# Patient Record
Sex: Female | Born: 1970 | Race: White | Hispanic: No | Marital: Single | Smoking: Current every day smoker
Health system: Southern US, Community
[De-identification: ages and names within clinical notes are randomized; demographics above are authoritative.]

## PROBLEM LIST (undated history)

## (undated) DIAGNOSIS — F419 Anxiety disorder, unspecified: Secondary | ICD-10-CM

---

## 2008-11-20 ENCOUNTER — Ambulatory Visit: Payer: Self-pay | Admitting: Obstetrics and Gynecology

## 2012-11-01 ENCOUNTER — Ambulatory Visit: Payer: Self-pay | Admitting: Obstetrics and Gynecology

## 2014-04-17 ENCOUNTER — Emergency Department: Payer: Self-pay | Admitting: Emergency Medicine

## 2014-05-25 ENCOUNTER — Emergency Department: Payer: Self-pay | Admitting: Emergency Medicine

## 2014-05-25 LAB — COMPREHENSIVE METABOLIC PANEL
ALBUMIN: 4.1 g/dL (ref 3.4–5.0)
ALK PHOS: 98 U/L
ANION GAP: 8 (ref 7–16)
BILIRUBIN TOTAL: 0.3 mg/dL (ref 0.2–1.0)
BUN: 9 mg/dL (ref 7–18)
CHLORIDE: 109 mmol/L — AB (ref 98–107)
CO2: 23 mmol/L (ref 21–32)
Calcium, Total: 8.9 mg/dL (ref 8.5–10.1)
Creatinine: 0.78 mg/dL (ref 0.60–1.30)
EGFR (African American): >60
EGFR (Non-African Amer.): >60
Glucose: 106 mg/dL — ABNORMAL HIGH (ref 65–99)
OSMOLALITY: 279 (ref 275–301)
POTASSIUM: 3.7 mmol/L (ref 3.5–5.1)
SGOT(AST): 23 U/L (ref 15–37)
SGPT (ALT): 21 U/L
SODIUM: 140 mmol/L (ref 136–145)
TOTAL PROTEIN: 7.8 g/dL (ref 6.4–8.2)

## 2014-05-25 LAB — CBC WITH DIFFERENTIAL/PLATELET
BASOS ABS: 0.1 10*3/uL (ref 0.0–0.1)
BASOS PCT: 0.6 %
EOS PCT: 0.2 %
Eosinophil #: 0 10*3/uL (ref 0.0–0.7)
HCT: 41.8 % (ref 35.0–47.0)
HGB: 14.2 g/dL (ref 12.0–16.0)
LYMPHS PCT: 20.8 %
Lymphocyte #: 3.3 10*3/uL (ref 1.0–3.6)
MCH: 33 pg (ref 26.0–34.0)
MCHC: 33.9 g/dL (ref 32.0–36.0)
MCV: 97 fL (ref 80–100)
Monocyte #: 1 x10 3/mm — ABNORMAL HIGH (ref 0.2–0.9)
Monocyte %: 6.1 %
Neutrophil #: 11.4 10*3/uL — ABNORMAL HIGH (ref 1.4–6.5)
Neutrophil %: 72.3 %
Platelet: 302 10*3/uL (ref 150–440)
RBC: 4.3 10*6/uL (ref 3.80–5.20)
RDW: 12.4 % (ref 11.5–14.5)
WBC: 15.7 10*3/uL — AB (ref 3.6–11.0)

## 2014-05-25 LAB — URINALYSIS, COMPLETE
BILIRUBIN, UR: NEGATIVE
Glucose,UR: NEGATIVE mg/dL (ref 0–75)
KETONE: NEGATIVE
Leukocyte Esterase: NEGATIVE
Nitrite: NEGATIVE
PH: 6 (ref 4.5–8.0)
Protein: NEGATIVE
RBC,UR: 1 /HPF (ref 0–5)
SPECIFIC GRAVITY: 1.002 (ref 1.003–1.030)
Squamous Epithelial: <1
WBC UR: NONE SEEN /HPF (ref 0–5)

## 2014-05-25 LAB — TROPONIN I: Troponin-I: <0.02 ng/mL

## 2014-05-25 LAB — PROTIME-INR
INR: 0.9
Prothrombin Time: 12.2 secs (ref 11.5–14.7)

## 2014-06-21 ENCOUNTER — Ambulatory Visit: Payer: Self-pay | Admitting: Family Medicine

## 2015-11-18 ENCOUNTER — Ambulatory Visit (INDEPENDENT_AMBULATORY_CARE_PROVIDER_SITE_OTHER): Payer: BC Managed Care – PPO

## 2015-11-18 ENCOUNTER — Ambulatory Visit
Admission: EM | Admit: 2015-11-18 | Discharge: 2015-11-18 | Disposition: A | Discharge status: Home / Self Care | Payer: BC Managed Care – PPO | Attending: Family Medicine | Admitting: Family Medicine

## 2015-11-18 ENCOUNTER — Encounter: Payer: Self-pay | Admitting: *Deleted

## 2015-11-18 DIAGNOSIS — S9031XA Contusion of right foot, initial encounter: Secondary | ICD-10-CM

## 2015-11-18 DIAGNOSIS — S92001A Unspecified fracture of right calcaneus, initial encounter for closed fracture: Secondary | ICD-10-CM

## 2015-11-18 HISTORY — DX: Anxiety disorder, unspecified: F41.9

## 2015-11-18 MED ORDER — IBUPROFEN 800 MG PO TABS: 800.0000 mg | ORAL_TABLET | Freq: Three times a day (TID) | ORAL | Status: AC | PRN

## 2015-11-18 MED ORDER — OXYCODONE-ACETAMINOPHEN 5-325 MG PO TABS: 1.0000 | ORAL_TABLET | Freq: Three times a day (TID) | ORAL | Status: DC | PRN

## 2015-11-18 NOTE — Discharge Instructions (Signed)
Take medication as prescribed. Rest. Elevate and apply ice. Use crutches and keep in splint.   Follow up with podiatry this week as scheduled.  Follow up with your primary care physician this week as needed. Return to Urgent care for new or worsening concerns.

## 2015-11-18 NOTE — ED Provider Notes (Signed)
[facility]  ____________________________________________  Time seen: Approximately 5:11 PM  I have reviewed the triage vital signs and the nursing notes.   HISTORY  Chief Complaint Foot Pain   HPI Tina Daniel is a 45 y.o. female presents with complaint of right lateral foot pain. Patient states that this morning she was at her home mowing her yard. Patient states she was using a push mower and accidentally rolled her right foot and ankle and she stepped off the curb from the grass. Patient presented she did fall but denies any other pain or injury. States right foot pain since. Denies head injury or loss of consciousness.   Patient reports that she was able to continue mowing as well as blow the sidewalk. However reports when she rested and elevated her foot after taking her shoe off, she then was able to apply weight. Patient reports 6 out of 10 pain at this time and increased pain with active weightbearing. Denies any pain radiation. Denies numbness or tingling sensation.  Patient again denies any other pain or injury. Denies head injury or loss of conscious. Denies neck or back pain or injury, or other extremity pain, other extremity swelling, recent sickness, dizziness, weakness, headache, vision changes, chest pain, shortness breath or abdominal pain.  Patient reports that she did call podiatry today to schedule a follow-up appointment prior to coming to urgent care. Patient states that she has an appointment with Dr. Orland Jarredroxler podiatrist this Wednesday.   [facility] PCP  No LMP recorded. Patient has had an ablation.  Past Medical History  Diagnosis Date  . Anxiety     There are no active problems to display for this patient.   History reviewed. No pertinent past surgical history.  Current Outpatient Rx  Name  Route  Sig  Dispense  Refill  . citalopram (CELEXA) 40 MG tablet   Oral   Take 40 mg by mouth daily.            Allergies Penicillins  History reviewed. No pertinent family history.  Social History Social History  Substance Use Topics  . Smoking status: Current Every Day Smoker  . Smokeless tobacco: Never Used  . Alcohol Use: Yes    Review of Systems Constitutional: No fever/chills Eyes: No visual changes. ENT: No sore throat. Cardiovascular: Denies chest pain. Respiratory: Denies shortness of breath. Gastrointestinal: No abdominal pain.  No nausea, no vomiting.  No diarrhea.  No constipation. Genitourinary: Negative for dysuria. Musculoskeletal: Negative for back pain.As above. Skin: Negative for rash. Neurological: Negative for headaches, focal weakness or numbness.  10-point ROS otherwise negative.  ____________________________________________   PHYSICAL EXAM:  VITAL SIGNS: ED Triage Vitals  Enc Vitals Group     BP 11/18/15 1623 128/78 mmHg     Pulse Rate 11/18/15 1623 101 Recheck 84      Resp 11/18/15 1623 18     Temp 11/18/15 1623 98.7 F (37.1 C)     Temp Source 11/18/15 1623 Oral     SpO2 11/18/15 1623 98 %     Weight 11/18/15 1623 165 lb (74.844 kg)     Height 11/18/15 1623 5\' 4"  (1.626 m)     Head Cir --      Peak Flow --      Pain Score 11/18/15 1635 8     Pain Loc --      Pain Edu? --      Excl. in GC? --     Constitutional: Alert  and oriented. Well appearing and in no acute distress. Eyes: Conjunctivae are normal. PERRL. EOMI. Head: Atraumatic.  Ears:  Normal external appearance bilaterally.  Nose: No congestion/rhinnorhea.  Mouth/Throat: Mucous membranes are moist.   Neck: No stridor.  No cervical spine tenderness to palpation. Cardiovascular: Normal rate, regular rhythm. Grossly normal heart sounds.  Good peripheral circulation. Respiratory: Normal respiratory effort.  No retractions. Lungs CTAB. no wheezes, rales or rhonchi. Gastrointestinal: Soft and nontender. No distention. No CVA tenderness. Musculoskeletal: No lower or upper extremity  tenderness nor edema.  No midline cervical, thoracic or lumbar tenderness patient. Bilateral pedal pulses equal and easily palpated.  Except: right dorsal lateral foot at base of 4/5 metatarsals mild swelling and ecchymosis, with moderate tenderness to palpation, pain with right foot dorsiflexion, no malleolar tenderness, sensation intact, capillary refill normal, no motor or tendon deficit, right lower extremity otherwise nontender.  Neurologic:  Normal speech and language. No gross focal neurologic deficits are appreciated. No gait instability. Skin:  Skin is warm, dry and intact. No rash noted. Psychiatric: Mood and affect are normal. Speech and behavior are normal.  ____________________________________________   LABS (all labs ordered are listed, but only abnormal results are displayed)  Labs Reviewed - No data to display ____________________________________________  RADIOLOGY  Dg Foot Complete Right  11/18/2015  CLINICAL DATA:  45 year old female with twisting injury while mowing the lawn. Pain and swelling near the base of the fourth metatarsal. Initial encounter. EXAM: RIGHT FOOT COMPLETE - 3+ VIEW COMPARISON:  None. FINDINGS: Overall bone mineralization is normal. On the lateral view the calcaneus appears intact with mild degenerative spurring. However, on the AP view there is mild cortical irregularity along the antral lateral calcaneus (arrow). Tarsal bone alignment within normal limits. TMT and other metatarsal alignment appears normal. No metatarsal fracture identified. There does appear to be soft tissue swelling lateral to the fifth metatarsal shaft. Distal joint spaces and alignment are within normal limits. Phalanges appear intact. IMPRESSION: 1. Appearance suspicious for avulsion fracture along the anterior lateral calcaneus, such as due to avulsion injury of the extensor digitorum brevis. 2. No other acute fracture or dislocation identified about the right foot. Electronically  Signed   By: Odessa Fleming M.D.   On: 11/18/2015 16:53   I, Renford Dills, personally viewed and evaluated these images (plain radiographs) as part of my medical decision making, as well as reviewing the written report by the radiologist. ____________________________________________   PROCEDURES  Procedure(s) performed:  Crutches and posterior ocl splint applied by RN, neurovascular intact post application.  ____________________________________________   INITIAL IMPRESSION / ASSESSMENT AND PLAN / ED COURSE  Pertinent labs & imaging results that were available during my care of the patient were reviewed by me and considered in my medical decision making (see chart for details).  Well-appearing patient. Presents for complaints of right lateral foot pain post mechanical injury prior to arrival today. Denies other pain or injury. Per radiologist's right foot x-ray appearance suspicious for avulsion fracture along the anterior lateral calcaneus, no other fracture or dislocation about the right foot. Discussed with patient. Posterior splint applied. Crutches given. Will treat with when necessary ibuprofen and Percocet. Encouraged rest, ice and elevation. Patient reports that she did call and obtain podiatry appointment for this week. Encouraged patient to keep podiatry appointment and follow-up this week.Discussed indication, risks and benefits of medications with patient.   Discussed follow up with Primary care physician this week. Discussed follow up and return parameters including no resolution or any  worsening concerns. Patient verbalized understanding and agreed to plan.   ____________________________________________   FINAL CLINICAL IMPRESSION(S) / ED DIAGNOSES  Final diagnoses:  Calcaneus fracture, right, closed, initial encounter  Foot contusion, right, initial encounter     New Prescriptions   IBUPROFEN (ADVIL,MOTRIN) 800 MG TABLET    Take 1 tablet (800 mg total) by mouth every 8  (eight) hours as needed for mild pain or moderate pain.   OXYCODONE-ACETAMINOPHEN (ROXICET) 5-325 MG TABLET    Take 1 tablet by mouth every 8 (eight) hours as needed for moderate pain or severe pain (Do not drive or operate heavy machinery while taking as can cause drowsiness.).    Note: This dictation was prepared with Dragon dictation along with smaller phrase technology. Any transcriptional errors that result from this process are unintentional.       Renford Dills, NP 11/18/15 1751  Renford Dills, NP 11/18/15 1751

## 2015-11-18 NOTE — ED Notes (Signed)
Patient was mowing grass today when she stepped off a curb and turned her ankle. No history of right foot injuries.

## 2016-09-30 ENCOUNTER — Other Ambulatory Visit: Payer: Self-pay | Admitting: Family Medicine

## 2016-09-30 DIAGNOSIS — Z1231 Encounter for screening mammogram for malignant neoplasm of breast: Secondary | ICD-10-CM

## 2016-10-12 ENCOUNTER — Ambulatory Visit
Admission: RE | Admit: 2016-10-12 | Discharge: 2016-10-12 | Disposition: A | Discharge status: Home / Self Care | Payer: BC Managed Care – PPO | Source: Ambulatory Visit | Attending: Family Medicine | Admitting: Family Medicine

## 2016-10-12 DIAGNOSIS — Z1231 Encounter for screening mammogram for malignant neoplasm of breast: Secondary | ICD-10-CM | POA: Insufficient documentation

## 2017-07-06 ENCOUNTER — Ambulatory Visit
Admission: EM | Admit: 2017-07-06 | Discharge: 2017-07-06 | Disposition: A | Discharge status: Home / Self Care | Payer: BC Managed Care – PPO | Attending: Family Medicine | Admitting: Family Medicine

## 2017-07-06 ENCOUNTER — Encounter: Payer: Self-pay | Admitting: Emergency Medicine

## 2017-07-06 ENCOUNTER — Other Ambulatory Visit: Payer: Self-pay

## 2017-07-06 DIAGNOSIS — R05 Cough: Secondary | ICD-10-CM | POA: Diagnosis not present

## 2017-07-06 DIAGNOSIS — R69 Illness, unspecified: Secondary | ICD-10-CM

## 2017-07-06 DIAGNOSIS — R5383 Other fatigue: Secondary | ICD-10-CM | POA: Diagnosis not present

## 2017-07-06 DIAGNOSIS — J029 Acute pharyngitis, unspecified: Secondary | ICD-10-CM

## 2017-07-06 DIAGNOSIS — J111 Influenza due to unidentified influenza virus with other respiratory manifestations: Secondary | ICD-10-CM

## 2017-07-06 DIAGNOSIS — M791 Myalgia, unspecified site: Secondary | ICD-10-CM | POA: Diagnosis not present

## 2017-07-06 MED ORDER — OSELTAMIVIR PHOSPHATE 75 MG PO CAPS: 75.0000 mg | ORAL_CAPSULE | Freq: Two times a day (BID) | ORAL | 0 refills | Status: DC

## 2017-07-06 MED ORDER — HYDROCOD POLST-CPM POLST ER 10-8 MG/5ML PO SUER: 5.0000 mL | Freq: Two times a day (BID) | ORAL | 0 refills | Status: DC

## 2017-07-06 MED ORDER — BENZONATATE 200 MG PO CAPS: ORAL_CAPSULE | ORAL | 0 refills | Status: DC

## 2017-07-06 NOTE — ED Triage Notes (Signed)
Patient c/o cough, congestion, fever, and bodyaches that started 2 days ago.

## 2017-07-06 NOTE — ED Provider Notes (Signed)
[facility]    CSN: [id number] Arrival date & time: 07/06/17  1848     History   Chief Complaint Chief Complaint  Patient presents with  . Cough  . Fever  . Generalized Body Aches    HPI Tina Daniel is a 47 y.o. female.   HPI  47 year old female, second grade teacher, presents with sudden onset of cough congestion fever and body aches that started 2 days ago.  First coughing was severe causing her to feel a burning sensation in her throat and through her anterior upper chest.  Running a fever in the low 100s.  She did not receive a flu shot this year nor ever.  Pulse rate is 105 today temperature is 99 degrees after taking NSAID at home shortly before coming here.         Past Medical History:  Diagnosis Date  . Anxiety     There are no active problems to display for this patient.   History reviewed. No pertinent surgical history.  OB History    No data available       Home Medications    Prior to Admission medications   Medication Sig Start Date End Date Taking? Authorizing Provider  citalopram (CELEXA) 40 MG tablet Take 40 mg by mouth daily.   Yes [provider]  benzonatate (TESSALON) 200 MG capsule Take one cap TID PRN cough 07/06/17   Lutricia Feil, PA-C  chlorpheniramine-HYDROcodone [facility] ER) 10-8 MG/5ML SUER Take 5 mLs by mouth 2 (two) times daily. 07/06/17   Lutricia Feil, PA-C  ibuprofen (ADVIL,MOTRIN) 800 MG tablet Take 1 tablet (800 mg total) by mouth every 8 (eight) hours as needed for mild pain or moderate pain. 11/18/15   Renford Dills, NP  oseltamivir (TAMIFLU) 75 MG capsule Take 1 capsule (75 mg total) by mouth every 12 (twelve) hours. 07/06/17   Lutricia Feil, PA-C    Family History Family History  Problem Relation Age of Onset  . Breast cancer Neg Hx     Social History Social History   Tobacco Use  . Smoking status: Current Every Day Smoker    Types: Cigarettes  . Smokeless  tobacco: Never Used  Substance Use Topics  . Alcohol use: Yes  . Drug use: No     Allergies   Penicillins   Review of Systems Review of Systems  Constitutional: Positive for activity change, chills, fatigue and fever.  HENT: Positive for congestion, postnasal drip and sore throat.   Respiratory: Positive for cough and shortness of breath.   Musculoskeletal: Positive for myalgias.  All other systems reviewed and are negative.    Physical Exam Triage Vital Signs ED Triage Vitals  Enc Vitals Group     BP 07/06/17 1858 113/76     Pulse Rate 07/06/17 1858 (!) 105     Resp 07/06/17 1858 16     Temp 07/06/17 1858 99 F (37.2 C)     Temp Source 07/06/17 1858 Oral     SpO2 07/06/17 1858 98 %     Weight 07/06/17 1855 175 lb (79.4 kg)     Height 07/06/17 1855 5\' 4"  (1.626 m)     Head Circumference --      Peak Flow --      Pain Score 07/06/17 1855 5     Pain Loc --      Pain Edu? --      Excl. in GC? --    No  data found.  Updated Vital Signs BP 113/76 (BP Location: Left Arm)   Pulse (!) 105   Temp 99 F (37.2 C) (Oral)   Resp 16   Ht 5\' 4"  (1.626 m)   Wt 175 lb (79.4 kg)   SpO2 98%   BMI 30.04 kg/m   Visual Acuity Right Eye Distance:   Left Eye Distance:   Bilateral Distance:    Right Eye Near:   Left Eye Near:    Bilateral Near:     Physical Exam  Constitutional: She is oriented to person, place, and time. She appears well-developed and well-nourished. No distress.  HENT:  Head: Normocephalic.  Right Ear: External ear normal.  Left Ear: External ear normal.  Nose: Nose normal.  Mouth/Throat: Oropharynx is clear and moist. No oropharyngeal exudate.  Eyes: Pupils are equal, round, and reactive to light. Right eye exhibits no discharge. Left eye exhibits no discharge.  Neck: Normal range of motion.  Pulmonary/Chest: Effort normal and breath sounds normal.  Musculoskeletal: Normal range of motion.  Lymphadenopathy:    She has no cervical adenopathy.    Neurological: She is alert and oriented to person, place, and time.  Skin: Skin is warm and dry. She is not diaphoretic.  Psychiatric: She has a normal mood and affect. Her behavior is normal. Judgment and thought content normal.  Nursing note and vitals reviewed.    UC Treatments / Results  Labs (all labs ordered are listed, but only abnormal results are displayed) Labs Reviewed - No data to display  EKG  EKG Interpretation None       Radiology No results found.  Procedures Procedures (including critical care time)  Medications Ordered in UC Medications - No data to display   Initial Impression / Assessment and Plan / UC Course  I have reviewed the triage vital signs and the nursing notes.  Pertinent labs & imaging results that were available during my care of the patient were reviewed by me and considered in my medical decision making (see chart for details).     Plan: 1. Test/x-ray results and diagnosis reviewed with patient 2. rx as per orders; risks, benefits, potential side effects reviewed with patient 3. Recommend supportive treatment with Tylenol for fever and body aches.  Treat the cough symptomatically.  Patient has elected to use Tamiflu.  And her to not return to school until she has been free of fever for at least 24 hours. If she worsens, she is to go the emergency room or return to our clinic. 4. F/u prn if symptoms worsen or don't improve   Final Clinical Impressions(s) / UC Diagnoses   Final diagnoses:  Influenza-like illness    ED Discharge Orders        Ordered    oseltamivir (TAMIFLU) 75 MG capsule  Every 12 hours     07/06/17 1922    benzonatate (TESSALON) 200 MG capsule     07/06/17 1922    chlorpheniramine-HYDROcodone (TUSSIONEX PENNKINETIC ER) 10-8 MG/5ML SUER  2 times daily     07/06/17 1922       Controlled Substance Prescriptions [place] Controlled Substance Registry consulted? Not Applicable   Lutricia FeilRoemer, Dossie Swor P, PA-C 07/06/17  [id number]

## 2017-07-06 NOTE — Discharge Instructions (Signed)
Plenty of fluids.  Rest as much as possible.  Use Tylenol or Motrin for fever and body aches

## 2018-02-05 IMAGING — MG MM DIGITAL SCREENING BILAT W/ TOMO W/ CAD
8 of 12 series · 8 of 28 positions shown · non-contrast
Comparison: Previous exam(s).

CLINICAL DATA: Screening.

EXAM:
2D DIGITAL SCREENING BILATERAL MAMMOGRAM WITH CAD AND ADJUNCT TOMO

[L MLO synth-2D]
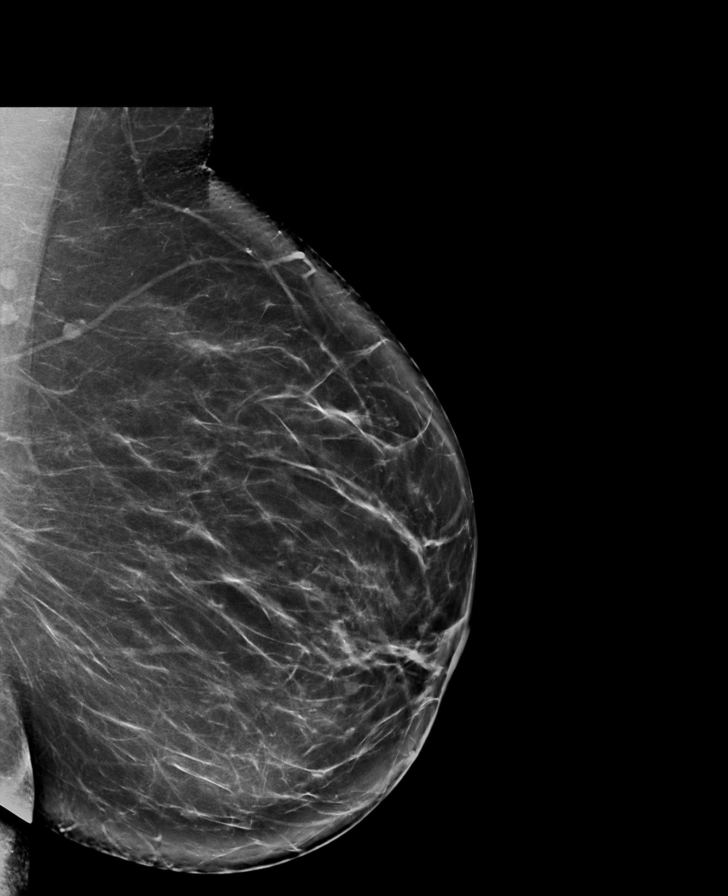

[L CC synth-2D]
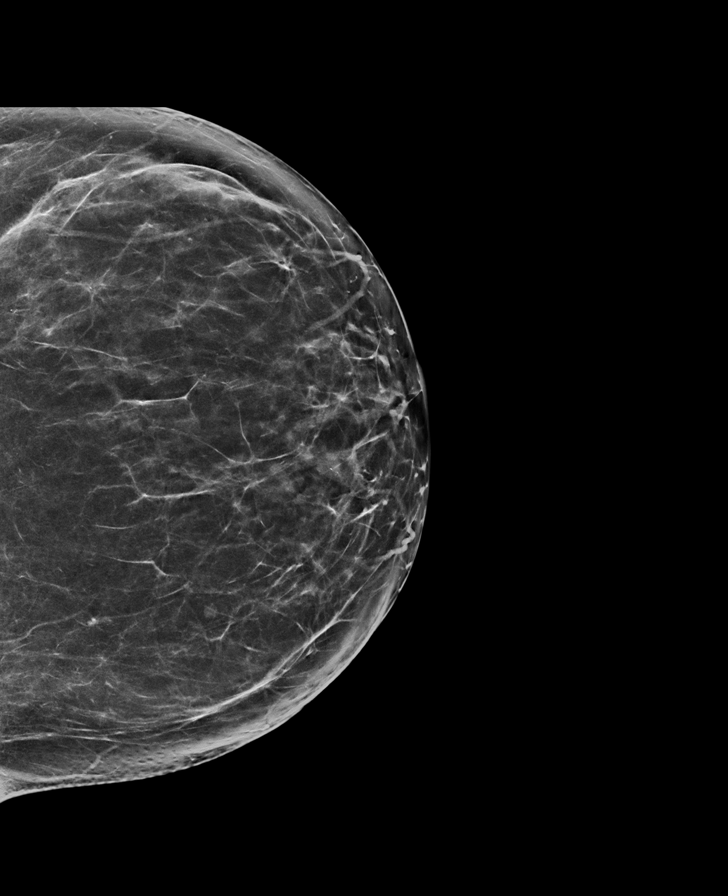

[L CC]
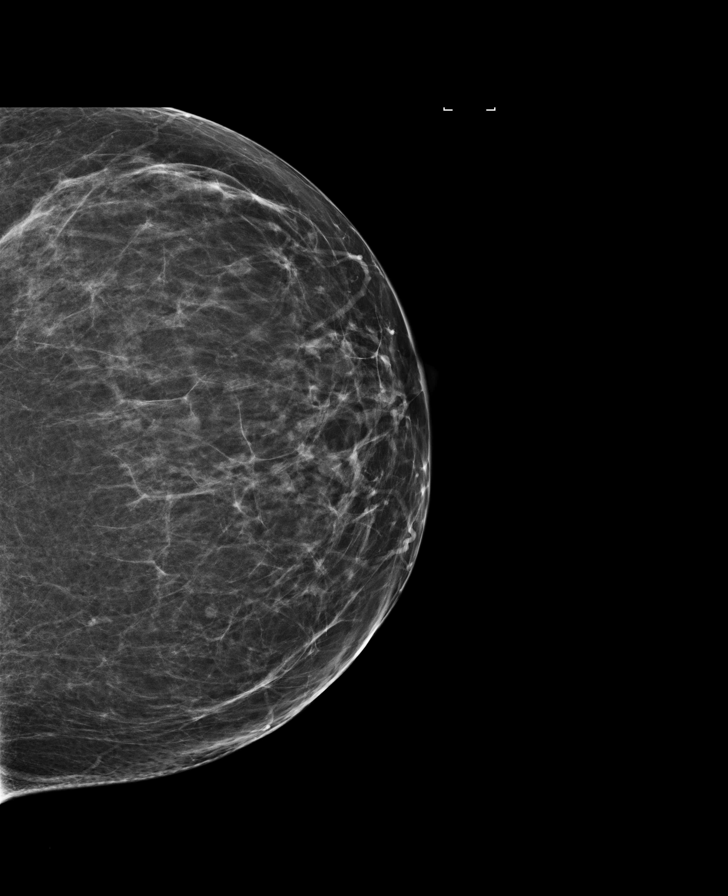

[R CC]
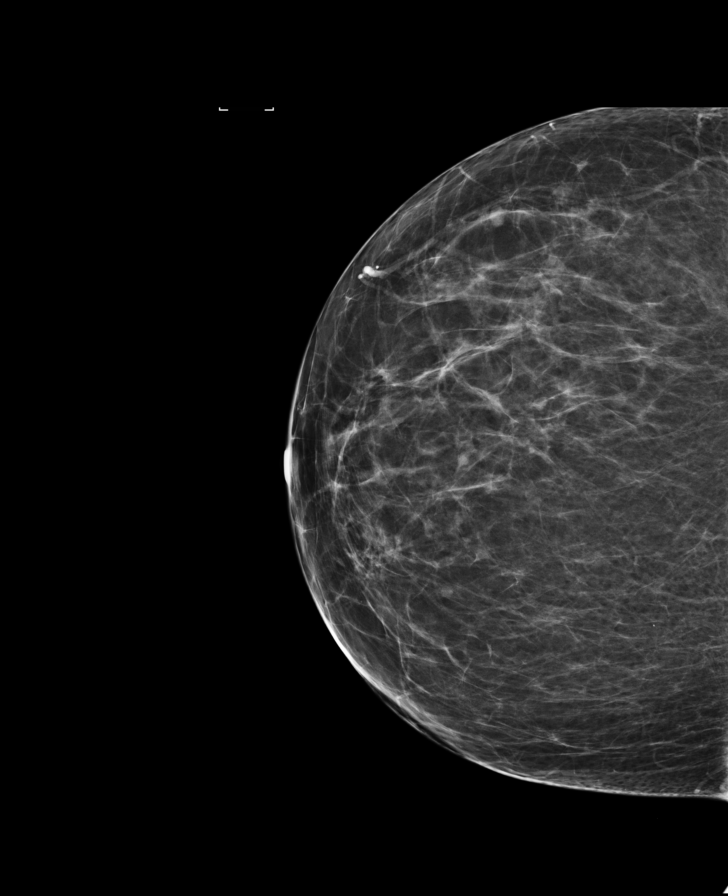

[R MLO]
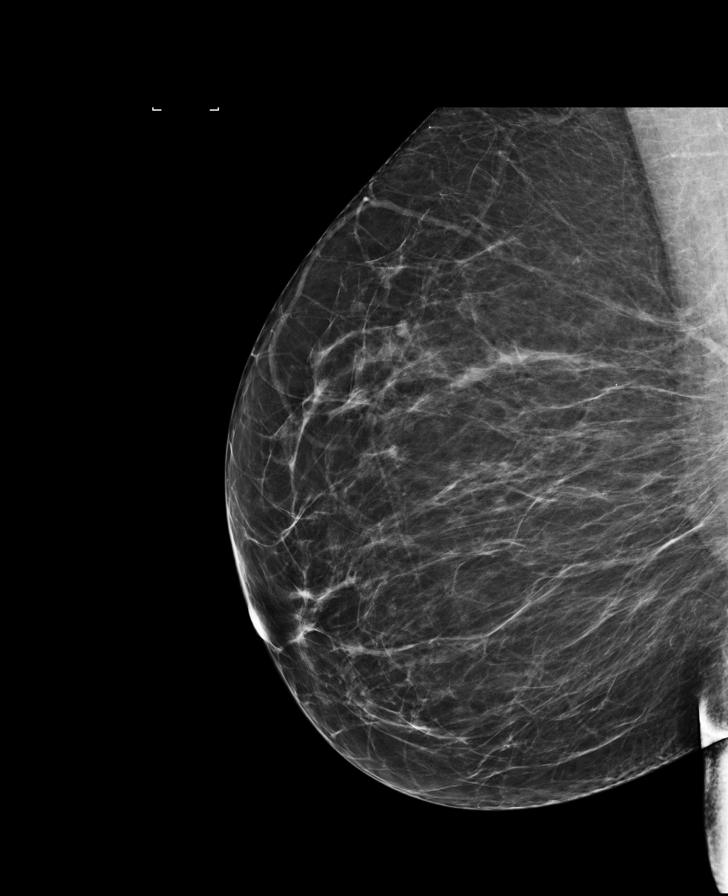

[L MLO]
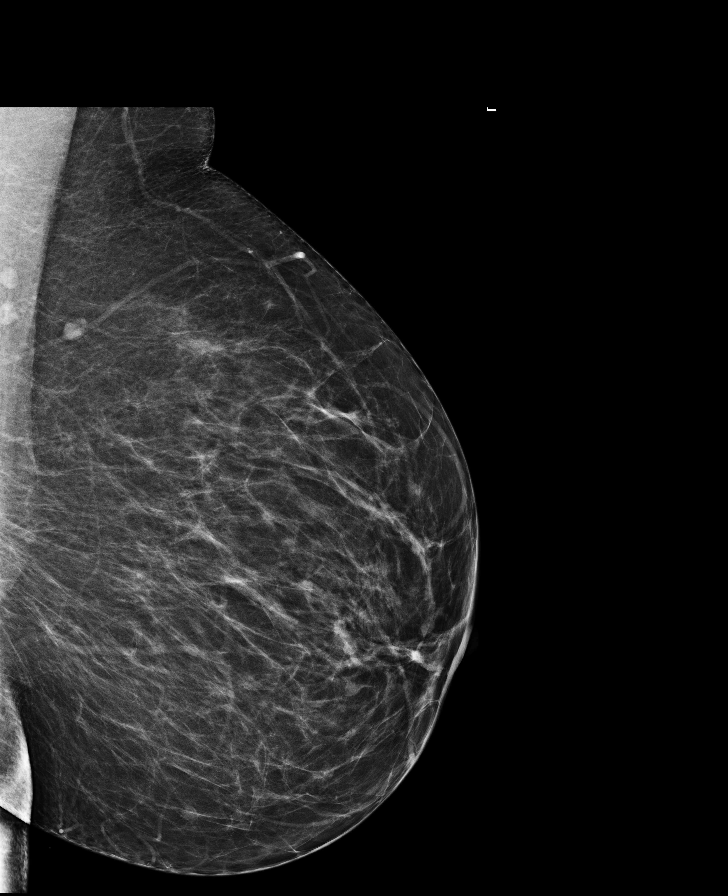

[R MLO synth-2D]
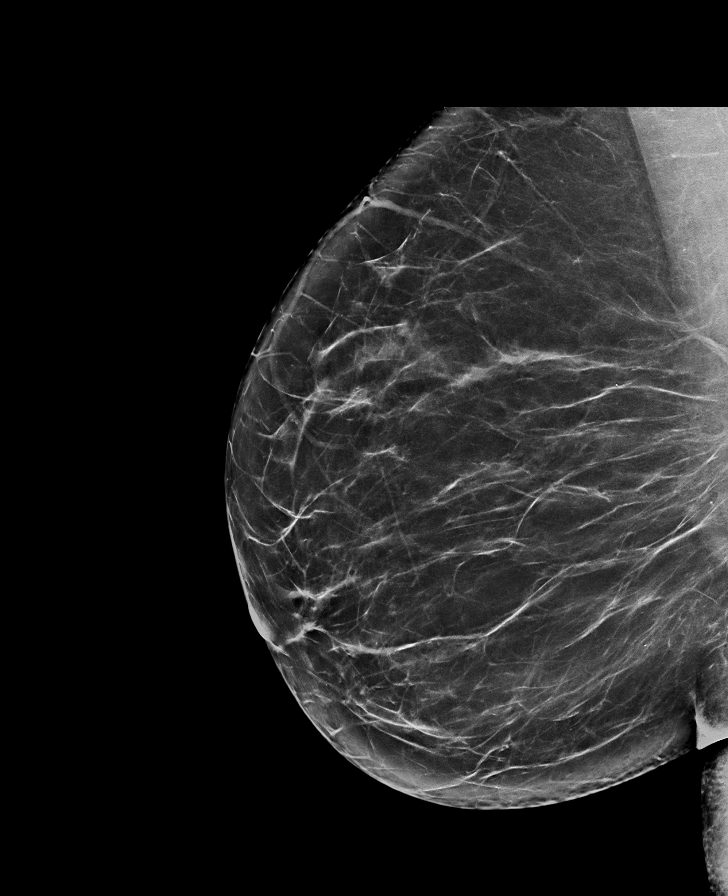

[R CC synth-2D]
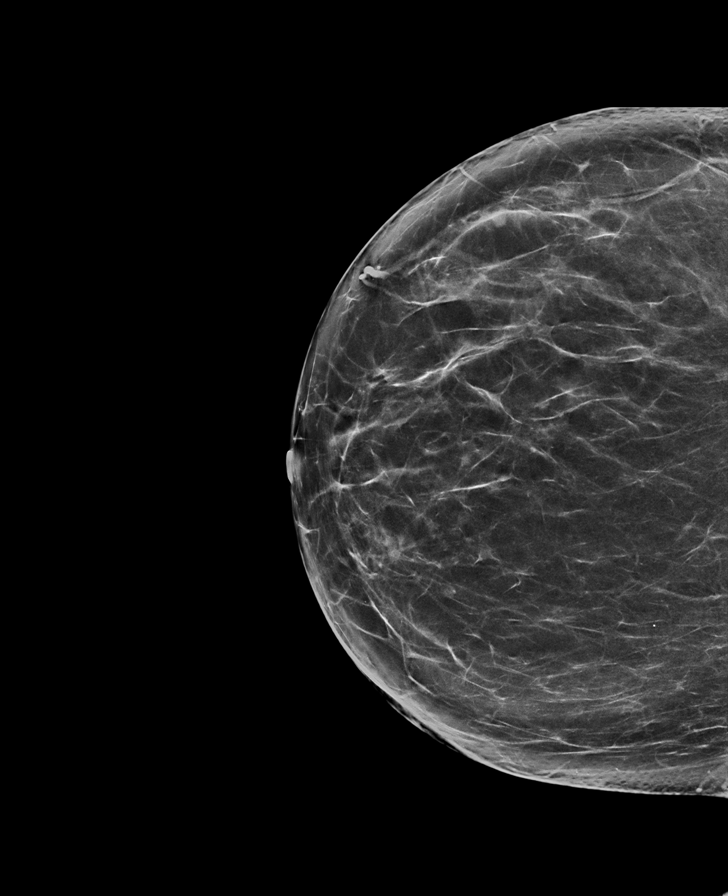

[8 of 28 positions shown; findings below may reference images not displayed]

ACR Breast Density Category b: There are scattered areas of
fibroglandular density.
FINDINGS: There are no findings suspicious for malignancy. Images were
processed with CAD.
IMPRESSION: No mammographic evidence of malignancy. A result letter of this
screening mammogram will be mailed directly to the patient.

RECOMMENDATION:
Screening mammogram in one year. (Code:97-6-RS4)

BI-RADS CATEGORY  1: Negative.

## 2020-11-14 ENCOUNTER — Other Ambulatory Visit: Payer: Self-pay | Admitting: Family Medicine

## 2020-11-14 DIAGNOSIS — Z1231 Encounter for screening mammogram for malignant neoplasm of breast: Secondary | ICD-10-CM

## 2020-11-21 ENCOUNTER — Other Ambulatory Visit: Payer: Self-pay

## 2020-11-21 ENCOUNTER — Ambulatory Visit: Admission: RE | Admit: 2020-11-21 | Payer: BLUE CROSS/BLUE SHIELD | Source: Ambulatory Visit

## 2021-03-16 ENCOUNTER — Other Ambulatory Visit: Payer: Self-pay

## 2021-03-16 ENCOUNTER — Ambulatory Visit
Admission: EM | Admit: 2021-03-16 | Discharge: 2021-03-16 | Disposition: A | Discharge status: Home / Self Care | Payer: BLUE CROSS/BLUE SHIELD | Attending: Internal Medicine | Admitting: Internal Medicine

## 2021-03-16 ENCOUNTER — Encounter: Payer: Self-pay | Admitting: Emergency Medicine

## 2021-03-16 DIAGNOSIS — B302 Viral pharyngoconjunctivitis: Secondary | ICD-10-CM

## 2021-03-16 DIAGNOSIS — R0789 Other chest pain: Secondary | ICD-10-CM

## 2021-03-16 MED ORDER — ERYTHROMYCIN 5 MG/GM OP OINT: TOPICAL_OINTMENT | Freq: Every day | OPHTHALMIC | 0 refills | Status: AC

## 2021-03-16 MED ORDER — PREDNISONE 20 MG PO TABS: 20.0000 mg | ORAL_TABLET | Freq: Every day | ORAL | 0 refills | Status: AC

## 2021-03-16 MED ORDER — ALBUTEROL SULFATE HFA 108 (90 BASE) MCG/ACT IN AERS: 1.0000 | INHALATION_SPRAY | Freq: Four times a day (QID) | RESPIRATORY_TRACT | 0 refills | Status: AC | PRN

## 2021-03-16 NOTE — ED Triage Notes (Signed)
Patient c/o sinus and head congestion, sore throat and right ear pain that started on Thursday.  Patient denies fevers.  Patient took 2 covid test on Thursday and Friday and both were negative.

## 2021-03-16 NOTE — ED Provider Notes (Signed)
[facility]    CSN: [id number] Arrival date & time: 03/16/21  1400      History   Chief Complaint Chief Complaint  Patient presents with   Sinus Problem   Otalgia    right   Sore Throat    HPI Tina Daniel is a 50 y.o. female was to the urgent care with a 3-day history of cough, nasal congestion, sore throat and right ear pain.  Patient's symptoms have been persistent.  She tested negative on 2 occasions for COVID using the home COVID test.  She has chest tightness but no significant cough or sputum production.  No fever or chills.  No nausea or vomiting.  Patient's son had similar episodes.  She has left eye redness with yellowish discharge.  Patient wears contact lens and currently has 1 in place.  No double vision or blurry vision.  No vomiting or diarrhea.  Patient's son tested negative for COVID.Marland Kitchen   HPI  Past Medical History:  Diagnosis Date   Anxiety     There are no problems to display for this patient.   History reviewed. No pertinent surgical history.  OB History   No obstetric history on file.      Home Medications    Prior to Admission medications   Medication Sig Start Date End Date Taking? Authorizing Provider  albuterol (VENTOLIN HFA) 108 (90 Base) MCG/ACT inhaler Inhale 1 puff into the lungs every 6 (six) hours as needed for wheezing or shortness of breath. 03/16/21  Yes Dyke Weible, Britta Mccreedy, MD  busPIRone (BUSPAR) 10 MG tablet  02/05/21  Yes [provider]  citalopram (CELEXA) 40 MG tablet Take 40 mg by mouth daily.   Yes [provider]  erythromycin ophthalmic ointment Place into the left eye at bedtime for 5 days. Place a 1/2 inch ribbon of ointment into the lower eyelid. 03/16/21 03/21/21 Yes Diedra Sinor, Britta Mccreedy, MD  predniSONE (DELTASONE) 20 MG tablet Take 1 tablet (20 mg total) by mouth daily for 5 days. 03/16/21 03/21/21 Yes Mohab Ashby, Britta Mccreedy, MD  ibuprofen (ADVIL,MOTRIN) 800 MG tablet Take 1 tablet (800 mg total) by  mouth every 8 (eight) hours as needed for mild pain or moderate pain. 11/18/15   Renford Dills, NP    Family History Family History  Problem Relation Age of Onset   Breast cancer Neg Hx     Social History Social History   Tobacco Use   Smoking status: Every Day    Types: Cigarettes   Smokeless tobacco: Never  Vaping Use   Vaping Use: Never used  Substance Use Topics   Alcohol use: Yes   Drug use: No     Allergies   Penicillins   Review of Systems Review of Systems  Constitutional: Negative.  Negative for chills and fever.  HENT:  Positive for congestion, postnasal drip and sore throat. Negative for sinus pressure and trouble swallowing.   Respiratory:  Positive for cough and chest tightness. Negative for shortness of breath.   Cardiovascular:  Negative for chest pain.  Gastrointestinal: Negative.  Negative for abdominal distention and abdominal pain.  Neurological:  Positive for headaches.    Physical Exam Triage Vital Signs ED Triage Vitals  Enc Vitals Group     BP 03/16/21 1423 127/84     Pulse Rate 03/16/21 1423 97     Resp 03/16/21 1423 14     Temp 03/16/21 1423 98.8 F (37.1 C)     Temp Source 03/16/21 1423  Oral     SpO2 03/16/21 1423 97 %     Weight 03/16/21 1419 178 lb (80.7 kg)     Height 03/16/21 1419 5\' 4"  (1.626 m)     Head Circumference --      Peak Flow --      Pain Score 03/16/21 1419 8     Pain Loc --      Pain Edu? --      Excl. in GC? --    No data found.  Updated Vital Signs BP 127/84 (BP Location: Right Arm)   Pulse 97   Temp 98.8 F (37.1 C) (Oral)   Resp 14   Ht 5\' 4"  (1.626 m)   Wt 80.7 kg   SpO2 97%   BMI 30.55 kg/m   Visual Acuity Right Eye Distance:   Left Eye Distance:   Bilateral Distance:    Right Eye Near:   Left Eye Near:    Bilateral Near:     Physical Exam Vitals and nursing note reviewed.  Constitutional:      Appearance: She is well-developed.  HENT:     Right Ear: Tympanic membrane normal.      Left Ear: Tympanic membrane normal.     Nose: Congestion present.     Mouth/Throat:     Mouth: Mucous membranes are moist. No oral lesions.     Pharynx: Posterior oropharyngeal erythema present.     Tonsils: No tonsillar exudate or tonsillar abscesses. 1+ on the right. 1+ on the left.  Eyes:     Comments: Left conjunctival erythema.  Cardiovascular:     Rate and Rhythm: Normal rate and regular rhythm.  Pulmonary:     Effort: Pulmonary effort is normal.     Breath sounds: Normal breath sounds.  Neurological:     Mental Status: She is alert.     UC Treatments / Results  Labs (all labs ordered are listed, but only abnormal results are displayed) Labs Reviewed - No data to display  EKG   Radiology No results found.  Procedures Procedures (including critical care time)  Medications Ordered in UC Medications - No data to display  Initial Impression / Assessment and Plan / UC Course  I have reviewed the triage vital signs and the nursing notes.  Pertinent labs & imaging results that were available during my care of the patient were reviewed by me and considered in my medical decision making (see chart for details).     1.  Viral pharyngoconjunctivitis: Warm salt water gargle Ibuprofen as needed for pain and/or fever Maintain adequate hydration Please do not wear your contacts until eye redness is resolved  2.  Chest tightness in the setting of chronic tobacco use: Albuterol inhaler Prednisone 20 mg orally daily If symptoms worsen please return to urgent care to be reevaluated No indication for chest x-ray.   Final Clinical Impressions(s) / UC Diagnoses   Final diagnoses:  Viral pharyngoconjunctivitis  Feeling of chest tightness     Discharge Instructions      Please maintain adequate hydration Use medications as prescribed If you have worsening symptoms please return to urgent care to be reevaluated.   ED Prescriptions     Medication Sig Dispense  Auth. Provider   albuterol (VENTOLIN HFA) 108 (90 Base) MCG/ACT inhaler Inhale 1 puff into the lungs every 6 (six) hours as needed for wheezing or shortness of breath. 18 g 13/06/22, MD   predniSONE (DELTASONE) 20 MG tablet Take 1 tablet (  20 mg total) by mouth daily for 5 days. 5 tablet Kaliope Quinonez, Britta Mccreedy, MD   erythromycin ophthalmic ointment Place into the left eye at bedtime for 5 days. Place a 1/2 inch ribbon of ointment into the lower eyelid. 3.5 g Karema Tocci, Britta Mccreedy, MD      PDMP not reviewed this encounter.   Merrilee Jansky, MD 03/16/21 1536

## 2021-03-16 NOTE — Discharge Instructions (Addendum)
Please maintain adequate hydration Use medications as prescribed If you have worsening symptoms please return to urgent care to be reevaluated.

## 2023-05-25 ENCOUNTER — Other Ambulatory Visit: Payer: Self-pay | Admitting: *Deleted

## 2023-05-25 ENCOUNTER — Inpatient Hospital Stay
Admission: RE | Admit: 2023-05-25 | Discharge: 2023-05-25 | Disposition: A | Discharge status: Home / Self Care | Payer: Self-pay | Source: Ambulatory Visit | Attending: Family Medicine | Admitting: Family Medicine

## 2023-05-25 ENCOUNTER — Encounter: Payer: Self-pay | Admitting: Family Medicine

## 2023-05-25 ENCOUNTER — Other Ambulatory Visit: Payer: Self-pay | Admitting: Family Medicine

## 2023-05-25 DIAGNOSIS — Z1231 Encounter for screening mammogram for malignant neoplasm of breast: Secondary | ICD-10-CM

## 2023-05-25 DIAGNOSIS — N6320 Unspecified lump in the left breast, unspecified quadrant: Secondary | ICD-10-CM

## 2023-05-25 DIAGNOSIS — R921 Mammographic calcification found on diagnostic imaging of breast: Secondary | ICD-10-CM

## 2023-05-31 ENCOUNTER — Ambulatory Visit
Admission: RE | Admit: 2023-05-31 | Discharge: 2023-05-31 | Disposition: A | Discharge status: Home / Self Care | Payer: 59 | Source: Ambulatory Visit | Attending: Family Medicine | Admitting: Family Medicine

## 2023-05-31 DIAGNOSIS — R921 Mammographic calcification found on diagnostic imaging of breast: Secondary | ICD-10-CM | POA: Diagnosis present

## 2023-05-31 DIAGNOSIS — N6320 Unspecified lump in the left breast, unspecified quadrant: Secondary | ICD-10-CM

## 2023-06-02 ENCOUNTER — Other Ambulatory Visit: Payer: Self-pay | Admitting: Family Medicine

## 2023-06-02 DIAGNOSIS — R921 Mammographic calcification found on diagnostic imaging of breast: Secondary | ICD-10-CM

## 2023-06-02 DIAGNOSIS — R928 Other abnormal and inconclusive findings on diagnostic imaging of breast: Secondary | ICD-10-CM

## 2023-06-07 ENCOUNTER — Ambulatory Visit
Admission: RE | Admit: 2023-06-07 | Discharge: 2023-06-07 | Disposition: A | Discharge status: Home / Self Care | Payer: 59 | Source: Ambulatory Visit | Attending: Family Medicine | Admitting: Family Medicine

## 2023-06-07 DIAGNOSIS — R928 Other abnormal and inconclusive findings on diagnostic imaging of breast: Secondary | ICD-10-CM | POA: Insufficient documentation

## 2023-06-07 DIAGNOSIS — R921 Mammographic calcification found on diagnostic imaging of breast: Secondary | ICD-10-CM

## 2023-06-07 HISTORY — PX: BREAST BIOPSY: SHX20

## 2023-06-07 MED ORDER — LIDOCAINE 1 % OPTIME INJ - NO CHARGE
5.0000 mL | Freq: Once | INTRAMUSCULAR | Status: AC
  Administered 2023-06-07: 5 mL
  Filled 2023-06-07: qty 6

## 2023-06-07 MED ORDER — LIDOCAINE-EPINEPHRINE 1 %-1:100000 IJ SOLN
20.0000 mL | Freq: Once | INTRAMUSCULAR | Status: AC
  Administered 2023-06-07: 20 mL
  Filled 2023-06-07: qty 20

## 2023-06-08 LAB — SURGICAL PATHOLOGY
